# Patient Record
Sex: Male | Born: 2000 | Hispanic: No | Marital: Single | Smoking: Never smoker
Health system: Southern US, Community
[De-identification: ages and names within clinical notes are randomized; demographics above are authoritative.]

## PROBLEM LIST (undated history)

## (undated) DIAGNOSIS — E109 Type 1 diabetes mellitus without complications: Secondary | ICD-10-CM

---
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
## 2020-09-25 ENCOUNTER — Emergency Department (HOSPITAL_COMMUNITY)
Admission: EM | Admit: 2020-09-25 | Discharge: 2020-09-25 | Disposition: A | Discharge status: Home / Self Care | Payer: BC Managed Care – PPO | Attending: Emergency Medicine | Admitting: Emergency Medicine

## 2020-09-25 ENCOUNTER — Encounter (HOSPITAL_COMMUNITY): Payer: Self-pay

## 2020-09-25 ENCOUNTER — Emergency Department (HOSPITAL_COMMUNITY): Payer: BC Managed Care – PPO

## 2020-09-25 ENCOUNTER — Other Ambulatory Visit: Payer: Self-pay

## 2020-09-25 DIAGNOSIS — S0081XA Abrasion of other part of head, initial encounter: Secondary | ICD-10-CM | POA: Diagnosis not present

## 2020-09-25 DIAGNOSIS — M25532 Pain in left wrist: Secondary | ICD-10-CM

## 2020-09-25 DIAGNOSIS — Y92410 Unspecified street and highway as the place of occurrence of the external cause: Secondary | ICD-10-CM | POA: Diagnosis not present

## 2020-09-25 DIAGNOSIS — S60812A Abrasion of left wrist, initial encounter: Secondary | ICD-10-CM | POA: Diagnosis not present

## 2020-09-25 DIAGNOSIS — T148XXA Other injury of unspecified body region, initial encounter: Secondary | ICD-10-CM

## 2020-09-25 DIAGNOSIS — S060X1A Concussion with loss of consciousness of 30 minutes or less, initial encounter: Secondary | ICD-10-CM | POA: Insufficient documentation

## 2020-09-25 DIAGNOSIS — S0990XA Unspecified injury of head, initial encounter: Secondary | ICD-10-CM
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
## 2020-09-25 NOTE — ED Notes (Signed)
Reviewed discharge instructions with patient and significant other. Follow-up care reviewed. Patient and significant other verbalized understanding. Patient A&Ox4, VSS, and ambulatory with steady gait upon discharge.  

## 2020-09-25 NOTE — Discharge Instructions (Addendum)
Your xrays and CT scans did not show any acute findings. Your headache may be related to a concussion. Please see attached information on concussions. It is recommended that you allow your brain to rest - this includes sitting in a darkened room whenever possible and avoiding bright lights including lights from cell phones, TV screens, computers, tablets, etc.   It is recommended that you take Ibuprofen as needed for pain. You can also apply neosporin (bacitracin) ointment to your skin abrasions on your left wrist and left cheek to help with healing.   Follow up with your PCP regarding your ED visit today.   If your headache persists you can follow up with the [facility] If you, your child, or a loved one you care for has suffered a sports-related head injury or potential concussion, we know how important it is to have access to the best advice and treatment. The [facility] is the only comprehensive, holistic concussion clinic in the [place], [place] area. You can speak with one of our concussion-trained staff members during our regular office hours, Monday - Thursday from 7:30 AM to 4:30 PM, and Fridays from 7:30 AM to 12:00 PM, by calling our Concussion Hotline: [phone].  Return to the ED IMMEDIATELY for any worsening symptoms including worsening headache, new onset confusion, vision changes, one sided weakness or numbness, vomiting, or any other new/concerning symptoms
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
## 2024-07-12 ENCOUNTER — Emergency Department (HOSPITAL_COMMUNITY)
Admission: EM | Admit: 2024-07-12 | Discharge: 2024-07-12 | Disposition: A | Discharge status: Home / Self Care | Attending: Emergency Medicine | Admitting: Emergency Medicine

## 2024-07-12 ENCOUNTER — Encounter (HOSPITAL_COMMUNITY): Payer: Self-pay

## 2024-07-12 ENCOUNTER — Other Ambulatory Visit: Payer: Self-pay

## 2024-07-12 DIAGNOSIS — J111 Influenza due to unidentified influenza virus with other respiratory manifestations: Secondary | ICD-10-CM

## 2024-07-12 DIAGNOSIS — J101 Influenza due to other identified influenza virus with other respiratory manifestations: Secondary | ICD-10-CM | POA: Diagnosis not present

## 2024-07-12 DIAGNOSIS — E109 Type 1 diabetes mellitus without complications: Secondary | ICD-10-CM | POA: Insufficient documentation

## 2024-07-12 DIAGNOSIS — R112 Nausea with vomiting, unspecified: Secondary | ICD-10-CM | POA: Diagnosis present

## 2024-07-12 HISTORY — DX: Type 1 diabetes mellitus without complications: E10.9

## 2024-07-12 LAB — RESP PANEL BY RT-PCR (RSV, FLU A&B, COVID)  RVPGX2
Influenza A by PCR: POSITIVE — AB
Influenza B by PCR: NEGATIVE
Resp Syncytial Virus by PCR: NEGATIVE
SARS Coronavirus 2 by RT PCR: NEGATIVE

## 2024-07-12 LAB — CBC
HCT: 47.9 % (ref 39.0–52.0)
Hemoglobin: 17.4 g/dL — ABNORMAL HIGH (ref 13.0–17.0)
MCH: 31.9 pg (ref 26.0–34.0)
MCHC: 36.3 g/dL — ABNORMAL HIGH (ref 30.0–36.0)
MCV: 87.9 fL (ref 80.0–100.0)
Platelets: 177 K/uL (ref 150–400)
RBC: 5.45 MIL/uL (ref 4.22–5.81)
RDW: 11 % — ABNORMAL LOW (ref 11.5–15.5)
WBC: 3.7 K/uL — ABNORMAL LOW (ref 4.0–10.5)
nRBC: 0 % (ref 0.0–0.2)

## 2024-07-12 LAB — COMPREHENSIVE METABOLIC PANEL WITH GFR
ALT: 24 U/L (ref 0–44)
AST: 58 U/L — ABNORMAL HIGH (ref 15–41)
Albumin: 4.4 g/dL (ref 3.5–5.0)
Alkaline Phosphatase: 95 U/L (ref 38–126)
Anion gap: 15 (ref 5–15)
BUN: 13 mg/dL (ref 6–20)
CO2: 24 mmol/L (ref 22–32)
Calcium: 9.5 mg/dL (ref 8.9–10.3)
Chloride: 99 mmol/L (ref 98–111)
Creatinine, Ser: 0.85 mg/dL (ref 0.61–1.24)
GFR, Estimated: >60 mL/min
Glucose, Bld: 205 mg/dL — ABNORMAL HIGH (ref 70–99)
Potassium: 4 mmol/L (ref 3.5–5.1)
Sodium: 138 mmol/L (ref 135–145)
Total Bilirubin: 1.7 mg/dL — ABNORMAL HIGH (ref 0.0–1.2)
Total Protein: 7.8 g/dL (ref 6.5–8.1)

## 2024-07-12 LAB — I-STAT CHEM 8, ED
BUN: 15 mg/dL (ref 6–20)
Calcium, Ion: 1.07 mmol/L — ABNORMAL LOW (ref 1.15–1.40)
Chloride: 97 mmol/L — ABNORMAL LOW (ref 98–111)
Creatinine, Ser: 0.9 mg/dL (ref 0.61–1.24)
Glucose, Bld: 202 mg/dL — ABNORMAL HIGH (ref 70–99)
HCT: 49 % (ref 39.0–52.0)
Hemoglobin: 16.7 g/dL (ref 13.0–17.0)
Potassium: 4.1 mmol/L (ref 3.5–5.1)
Sodium: 137 mmol/L (ref 135–145)
TCO2: 27 mmol/L (ref 22–32)

## 2024-07-12 LAB — LIPASE, BLOOD: Lipase: <10 U/L — ABNORMAL LOW (ref 11–51)

## 2024-07-12 LAB — CBG MONITORING, ED: Glucose-Capillary: 227 mg/dL — ABNORMAL HIGH (ref 70–99)

## 2024-07-12 MED ORDER — ONDANSETRON HCL 4 MG/2ML IJ SOLN
4.0000 mg | Freq: Once | INTRAMUSCULAR | Status: AC
  Administered 2024-07-12: 4 mg via INTRAVENOUS
  Filled 2024-07-12: qty 2

## 2024-07-12 MED ORDER — ONDANSETRON 4 MG PO TBDP: 4.0000 mg | ORAL_TABLET | Freq: Three times a day (TID) | ORAL | 0 refills | Status: AC | PRN

## 2024-07-12 MED ORDER — KETOROLAC TROMETHAMINE 15 MG/ML IJ SOLN
15.0000 mg | Freq: Once | INTRAMUSCULAR | Status: AC
  Administered 2024-07-12: 15 mg via INTRAVENOUS
  Filled 2024-07-12: qty 1

## 2024-07-12 MED ORDER — LACTATED RINGERS IV BOLUS
1000.0000 mL | Freq: Once | INTRAVENOUS | Status: AC
  Administered 2024-07-12: 1000 mL via INTRAVENOUS

## 2024-07-12 NOTE — Discharge Instructions (Signed)
 Viral Illness TREATMENT  You tested positive for influenza A Zofran  for nausea Please use Tylenol or ibuprofen for pain.  You may use 600 mg ibuprofen every 6 hours or 1000 mg of Tylenol every 6 hours.  You may choose to alternate between the 2.  This would be most effective.  Not to exceed 4 g of Tylenol within 24 hours.  Not to exceed 3200 mg ibuprofen 24 hours.  Treatment is directed at relieving symptoms. There is no cure. Antibiotics are not effective, because the infection is caused by a virus, not by bacteria. Treatment may include:  Increased fluid intake. Sports drinks offer valuable electrolytes, sugars, and fluids.  Breathing heated mist or steam (vaporizer or shower).  Eating chicken soup or other clear broths, and maintaining good nutrition.  Getting plenty of rest.  Using gargles or lozenges for comfort.  Increasing usage of your inhaler if you have asthma.  Return to work when your temperature has returned to normal.  Gargle warm salt water and spit it out for sore throat. Take benadryl to decrease sinus secretions. Continue to alternate between Tylenol and ibuprofen for pain and fever control.  Follow Up: Follow up with your primary care doctor in 5-7 days for recheck of ongoing symptoms.  Return to emergency department for emergent changing or worsening of symptoms.

## 2024-07-12 NOTE — ED Notes (Signed)
 Pt and family educated on DC instructions and verbalize understanding , pt with steady gait to lobby, RR even and unlabored

## 2024-07-12 NOTE — ED Notes (Signed)
 Pt stating he feels like he is going to pass out. CBG obtained

## 2024-07-12 NOTE — ED Triage Notes (Signed)
 Headache, body aches, vomiting since Tuesday.

## 2024-07-12 NOTE — ED Notes (Addendum)
 Pt is aware of a urine sample being needed. Pt unable to go at this time. Specimen cup placed on the table at bedside. RN notified.

## 2024-07-12 NOTE — ED Provider Notes (Signed)
 " [place] EMERGENCY DEPARTMENT AT [facility] Provider Note   CSN: [id number] Arrival date & time: 07/12/24  0900     Patient presents with: Generalized Body Aches   Eric Harvey is a 23 y.o. male.   HPI  Patient is a 23 year old male present emergency room today with complaints of nausea vomiting fatigue body aches and sinus congestion and coughing.  He denies any chest pain states he is not particularly short of breath.  He feels very tired and generally unwell.  He has been around some people recently were sick.  He is a type I diabetic states he has been giving himself insulin as directed by his CBGs that he has been reliably taking.     Prior to Admission medications  Medication Sig Start Date End Date Taking? Authorizing Provider  ondansetron  (ZOFRAN -ODT) 4 MG disintegrating tablet Take 1 tablet (4 mg total) by mouth every 8 (eight) hours as needed for nausea or vomiting. 07/12/24  Yes Neldon Hamp RAMAN, PA    Allergies: Patient has no known allergies.    Review of Systems  Updated Vital Signs BP 128/67 (BP Location: Right Arm)   Pulse 78   Temp 98.9 F (37.2 C) (Oral)   Resp 16   Ht 6' 2 (1.88 m)   Wt 74.8 kg   SpO2 96%   BMI 21.18 kg/m   Physical Exam Vitals and nursing note reviewed.  Constitutional:      General: He is not in acute distress. HENT:     Head: Normocephalic and atraumatic.     Nose: Nose normal.     Mouth/Throat:     Mouth: Mucous membranes are dry.  Eyes:     General: No scleral icterus. Cardiovascular:     Rate and Rhythm: Regular rhythm. Tachycardia present.     Pulses: Normal pulses.     Heart sounds: Normal heart sounds.  Pulmonary:     Effort: Pulmonary effort is normal. No respiratory distress.     Breath sounds: No wheezing.  Abdominal:     Palpations: Abdomen is soft.     Tenderness: There is no abdominal tenderness. There is no guarding or rebound.  Musculoskeletal:     Cervical back: Normal range of  motion.     Right lower leg: No edema.     Left lower leg: No edema.  Skin:    General: Skin is warm and dry.     Capillary Refill: Capillary refill takes less than 2 seconds.  Neurological:     Mental Status: He is alert. Mental status is at baseline.  Psychiatric:        Mood and Affect: Mood normal.        Behavior: Behavior normal.     (all labs ordered are listed, but only abnormal results are displayed) Labs Reviewed  RESP PANEL BY RT-PCR (RSV, FLU A&B, COVID)  RVPGX2 - Abnormal; Notable for the following components:      Result Value   Influenza A by PCR POSITIVE (*)    All other components within normal limits  CBC - Abnormal; Notable for the following components:   WBC 3.7 (*)    Hemoglobin 17.4 (*)    MCHC 36.3 (*)    RDW 11.0 (*)    All other components within normal limits  COMPREHENSIVE METABOLIC PANEL WITH GFR - Abnormal; Notable for the following components:   Glucose, Bld 205 (*)    AST 58 (*)    Total  Bilirubin 1.7 (*)    All other components within normal limits  LIPASE, BLOOD - Abnormal; Notable for the following components:   Lipase <10 (*)    All other components within normal limits  CBG MONITORING, ED - Abnormal; Notable for the following components:   Glucose-Capillary 227 (*)    All other components within normal limits  I-STAT CHEM 8, ED - Abnormal; Notable for the following components:   Chloride 97 (*)    Glucose, Bld 202 (*)    Calcium, Ion 1.07 (*)    All other components within normal limits  URINALYSIS, ROUTINE W REFLEX MICROSCOPIC  CBG MONITORING, ED    EKG: None  Radiology: No results found.   Procedures   Medications Ordered in the ED  ondansetron  (ZOFRAN ) injection 4 mg (has no administration in time range)  ondansetron  (ZOFRAN ) injection 4 mg (4 mg Intravenous Given 07/12/24 1050)  lactated ringers  bolus 1,000 mL (1,000 mLs Intravenous New Bag/Given 07/12/24 1050)  ketorolac  (TORADOL ) 15 MG/ML injection 15 mg (15 mg  Intravenous Given 07/12/24 1050)    Clinical Course as of 07/12/24 1158  Fri Jul 12, 2024  1107 Anion gap: 15 [WF]  1148 Influenza A By PCR(!): POSITIVE [WF]    Clinical Course User Index [WF] Neldon Hamp RAMAN, PA                                 Medical Decision Making Amount and/or Complexity of Data Reviewed Labs: ordered. Decision-making details documented in ED Course.  Risk Prescription drug management.   This patient presents to the ED for concern of nausea vomiting, this involves a number of treatment options, and is a complaint that carries with it a moderate risk of complications and morbidity. A differential diagnosis was considered for the patient's symptoms which is discussed below:   The emergent differential diagnosis for vomiting includes, but is not limited to ACS/MI, Boerhaave's, DKA, Intracranial Hemorrhage, Ischemic bowel, Meningitis, Sepsis, Acute gastric dilation, Acetaminophen toxicity, Adrenal insufficiency, Appendicitis, Aspirin toxicity, Bowel obstruction/ileus, Cholecystitis, CNS tumor. Electrolyte abnormalities, Elevated ICP, Gastric outlet obstruction, Hyperemesis gravidarum, Pancreatitis, Peritonitis, Ruptured viscus, Testicular torsion/ovarian torsion, Biliary colic, Cannabinoid hyperemesis syndrome, Disulfiram effect, ETOH, Gastritis, Gastroenteritis, Gastroparesis, Hepatitis, Ibuprofen, Labyrinthitis, Migraine, Motion sickness, Narcotic withdrawal, Thyroid, Pregnancy, Peptic ulcer disease, Renal colic, and UTI    Co morbidities: Discussed in HPI   Brief History:  Patient is a 23 year old male present emergency room today with complaints of nausea vomiting fatigue body aches and sinus congestion and coughing.  He denies any chest pain states he is not particularly short of breath.  He feels very tired and generally unwell.  He has been around some people recently were sick.  He is a type I diabetic states he has been giving himself insulin as directed  by his CBGs that he has been reliably taking.    EMR reviewed including pt PMHx, past surgical history and past visits to ER.   See HPI for more details   Lab Tests:   I personally reviewed all laboratory work and imaging. Metabolic panel without any acute abnormality specifically kidney function within normal limits and no significant electrolyte abnormalities. CBC without leukocytosis or significant anemia. Reassuringly normal labs.  Positive influenza.  Imaging Studies:  No imaging studies ordered for this patient    Cardiac Monitoring:  The patient was maintained on a cardiac monitor.  I personally viewed and interpreted the cardiac monitored  which showed an underlying rhythm of: NSR NA   Medicines ordered:  I ordered medication including Toradol , Zofran , lactated Ringer 's, Zofran  for hydration, pain, nausea Reevaluation of the patient after these medicines showed that the patient improved I have reviewed the patients home medicines and have made adjustments as needed   Critical Interventions:     Consults/Attending Physician      Reevaluation:  After the interventions noted above I re-evaluated patient and found that they have :improved   Social Determinants of Health:      Problem List / ED Course:  Patient has nausea vomiting abdominal pain fatigue and some loose stool test positive influenza feels much improved after IV hydration.  Normal labs reassuring abdominal exam on repeat exam as well.  Recommended Tylenol ibuprofen Zofran  hydration rest and follow-up outpatient.  Patient is tolerant p.o. ambulatory and requesting discharge.   Dispostion:  After consideration of the diagnostic results and the patients response to treatment, I feel that the patent would benefit from outpatient follow-up with strict return precautions to the emergency room.  Final diagnoses:  Influenza    ED Discharge Orders          Ordered    ondansetron   (ZOFRAN -ODT) 4 MG disintegrating tablet  Every 8 hours PRN        07/12/24 1154               Neldon Hamp RAMAN, [place] 07/12/24 1158    Ellouise Richerd POUR, DO 07/12/24 1354  "
